# Patient Record
Sex: Male | Born: 1985 | Race: White | Hispanic: No | Marital: Married | Smoking: Former smoker
Health system: Southern US, Community
[De-identification: ages and names within clinical notes are randomized; demographics above are authoritative.]

---

## 2015-04-08 ENCOUNTER — Emergency Department
Admission: EM | Admit: 2015-04-08 | Discharge: 2015-04-08 | Disposition: A | Discharge status: Home / Self Care | Payer: Managed Care, Other (non HMO) | Source: Home / Self Care | Attending: Family Medicine | Admitting: Family Medicine

## 2015-04-08 ENCOUNTER — Encounter: Payer: Self-pay | Admitting: *Deleted

## 2015-04-08 ENCOUNTER — Emergency Department (INDEPENDENT_AMBULATORY_CARE_PROVIDER_SITE_OTHER): Payer: Managed Care, Other (non HMO)

## 2015-04-08 DIAGNOSIS — S8391XA Sprain of unspecified site of right knee, initial encounter: Secondary | ICD-10-CM | POA: Diagnosis not present

## 2015-04-08 DIAGNOSIS — M25561 Pain in right knee: Secondary | ICD-10-CM

## 2015-04-08 NOTE — ED Notes (Signed)
Pt was playing basketball this AM, planted foot then was run into by another player. Taken  IBF, denies pain currently but knee feel "loose". No previous injury. Wearing brace.

## 2015-04-08 NOTE — ED Provider Notes (Signed)
CSN: [id number]     Arrival date & time 04/08/15  4098 History   First MD Initiated Contact with Patient 04/08/15 [phone]     Chief Complaint  Patient presents with  . Knee Injury   (Consider location/radiation/quality/duration/timing/severity/associated sxs/prior Treatment) HPI  The pt is a 30yo male presenting to [facility] with c/o Right lateral knee pain that started suddenly earlier this morning while playing basketball. Pt had his legs planted when another player accidentally ran into the side of his knee, causing it to buckle inward.  Pain is aching and throbbing, sharp at times.  Pain is minimal at this time, his main concern is his knee feels "loose."  He has been wearing a knee sleeve for added support and took  ibuprofen just PTA.  No other injuries. Denies prior Right knee injuries or surgeries.   History reviewed. No pertinent past medical history. History reviewed. No pertinent past surgical history. History reviewed. No pertinent family history. Social History  Substance Use Topics  . Smoking status: Former Games developer  . Smokeless tobacco: Current User  . Alcohol Use: Yes    Review of Systems  Musculoskeletal: Positive for myalgias and arthralgias. Negative for joint swelling.       Right knee  Skin: Negative for color change and wound.  Neurological: Positive for weakness (Right knee). Negative for numbness.    Allergies  Review of patient's allergies indicates no known allergies.  Home Medications   Prior to Admission medications   Not on File   Meds Ordered and Administered this Visit  Medications - No data to display  BP 136/86 mmHg  Pulse 55  Resp 14  Ht  (1.854 m)  Wt 219 lb (99.338 kg)  BMI 28.90 kg/m2  SpO2 100% No data found.   Physical Exam  Constitutional: He is oriented to person, place, and time. He appears well-developed and well-nourished.  HENT:  Head: Normocephalic and atraumatic.  Eyes: EOM are normal.  Neck: Normal range of motion.   Cardiovascular: Normal rate.   Pulmonary/Chest: Effort normal.  Musculoskeletal: Normal range of motion. He exhibits tenderness. He exhibits no edema.  Right knee: no obvious edema or deformity. Slight decreased ROM with knee extension and knee flexion limited to 100*  Calf soft, non-tender. Able to bear weight on Right leg.   Neurological: He is alert and oriented to person, place, and time.  Skin: Skin is warm and dry.  Right knee: skin in tact. No ecchymosis or erythema.   Psychiatric: He has a normal mood and affect. His behavior is normal.  Nursing note and vitals reviewed.   ED Course  Procedures (including critical care time)  Labs Review Labs Reviewed - No data to display  Imaging Review Dg Knee Complete 4 Views Right  04/08/2015  CLINICAL DATA:  Pt c/o lateral right knee pain and tenderness, pt was playing basketball this morning and states he knee bent from lateral to medial and he heard a pop EXAM: RIGHT KNEE - COMPLETE 4+ VIEW COMPARISON:  None. FINDINGS: There is no evidence of fracture, dislocation, or joint effusion. There is no evidence of arthropathy or other focal bone abnormality. Soft tissues are unremarkable. IMPRESSION: Negative. Electronically Signed   By: Corlis Leak M.D.   On: 04/08/2015 10:26      MDM   1. Right knee sprain, initial encounter    Pt c/o Right knee pain and sensation of knee feeling "loose" after injury during basketball just PTA.  Right knee: PMS in  tact with just slight decreased ROM due to pain.  Plain films: negative for acute bony injury, no joint effusion Pain likely due to knee sprain.  Pt has his own knee sleeve he may continue to use.  Encouraged R.I.C.E- Apply a compressive ACE bandage. Rest and elevate the affected painful area.  Apply cold compresses intermittently as needed.  As pain recedes, begin normal activities slowly as tolerated.    Pt is a IT sales professional. Note for light duty for 1 week provided. Encouraged to f/u with  Dr. Denyse Amass, Sports Medicine next week if pain persists. Patient verbalized understanding and agreement with treatment plan. r   Junius Finner, PA-C 04/08/15 1037

## 2016-11-05 IMAGING — CR DG KNEE COMPLETE 4+V*R*
4 series · 4 of 4 positions shown · non-contrast
Comparison: None.

CLINICAL DATA: Pt c/o lateral right knee pain and tenderness, pt
was playing basketball this morning and states he knee bent from
lateral to medial and he heard a pop

EXAM:
RIGHT KNEE - COMPLETE 4+ VIEW

[knee ap]
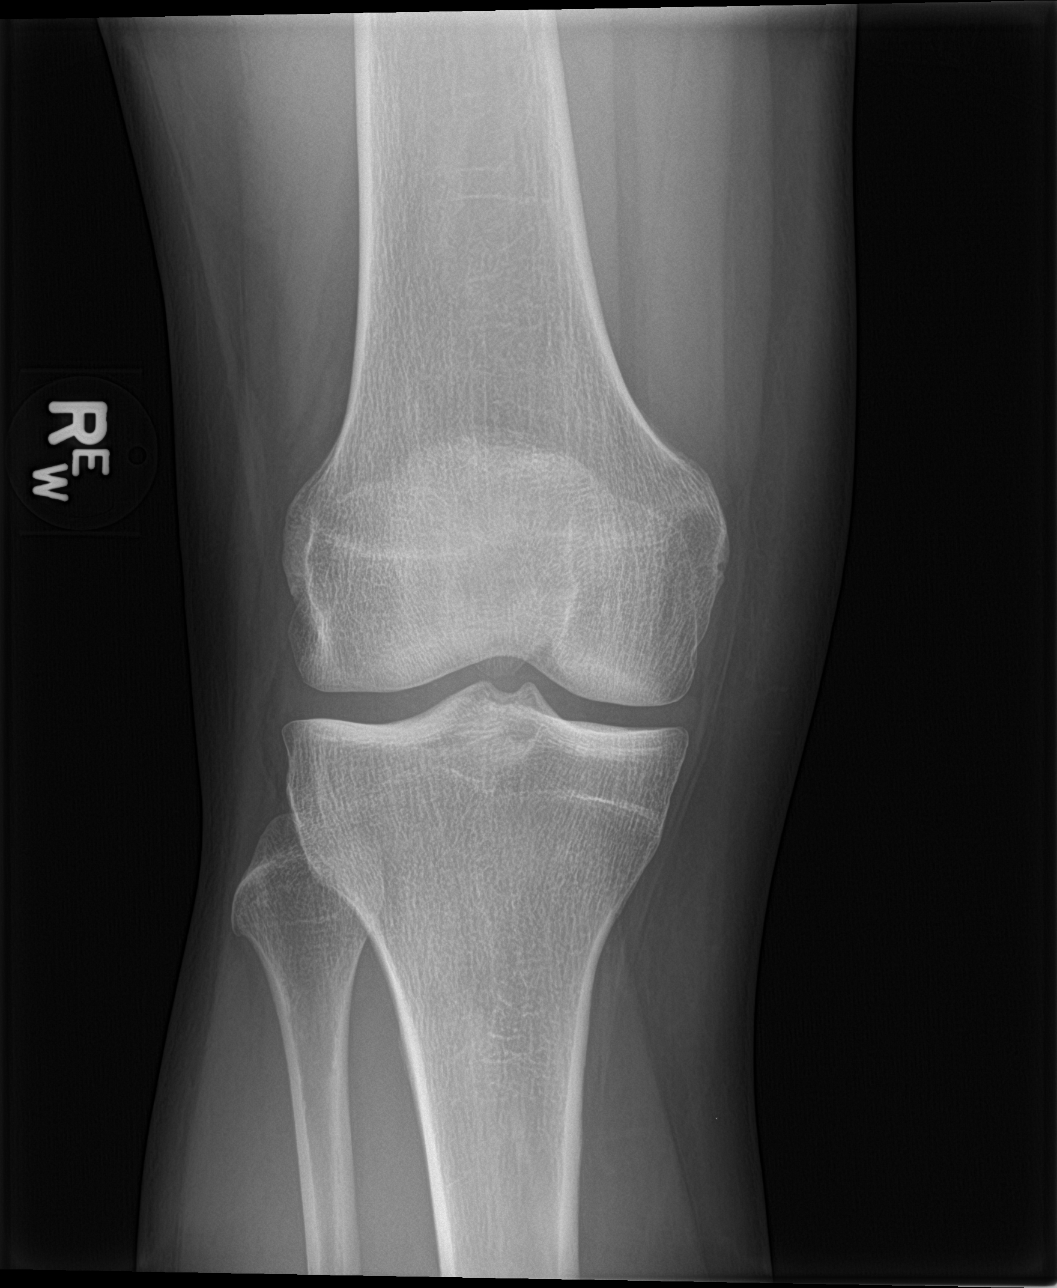

[knee lat]
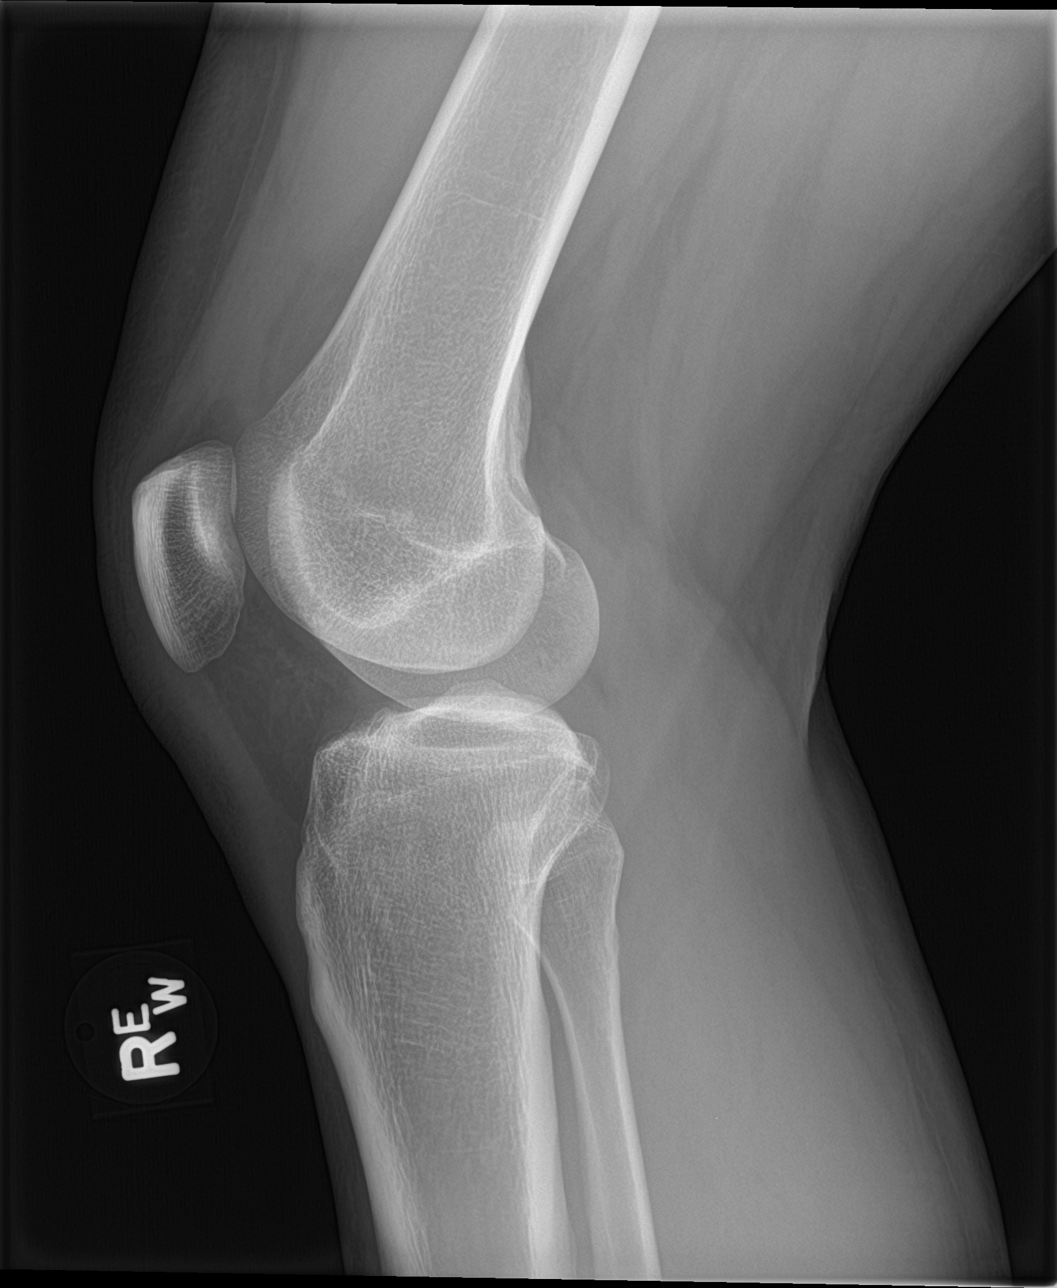

[knee obl (1 of 2)]
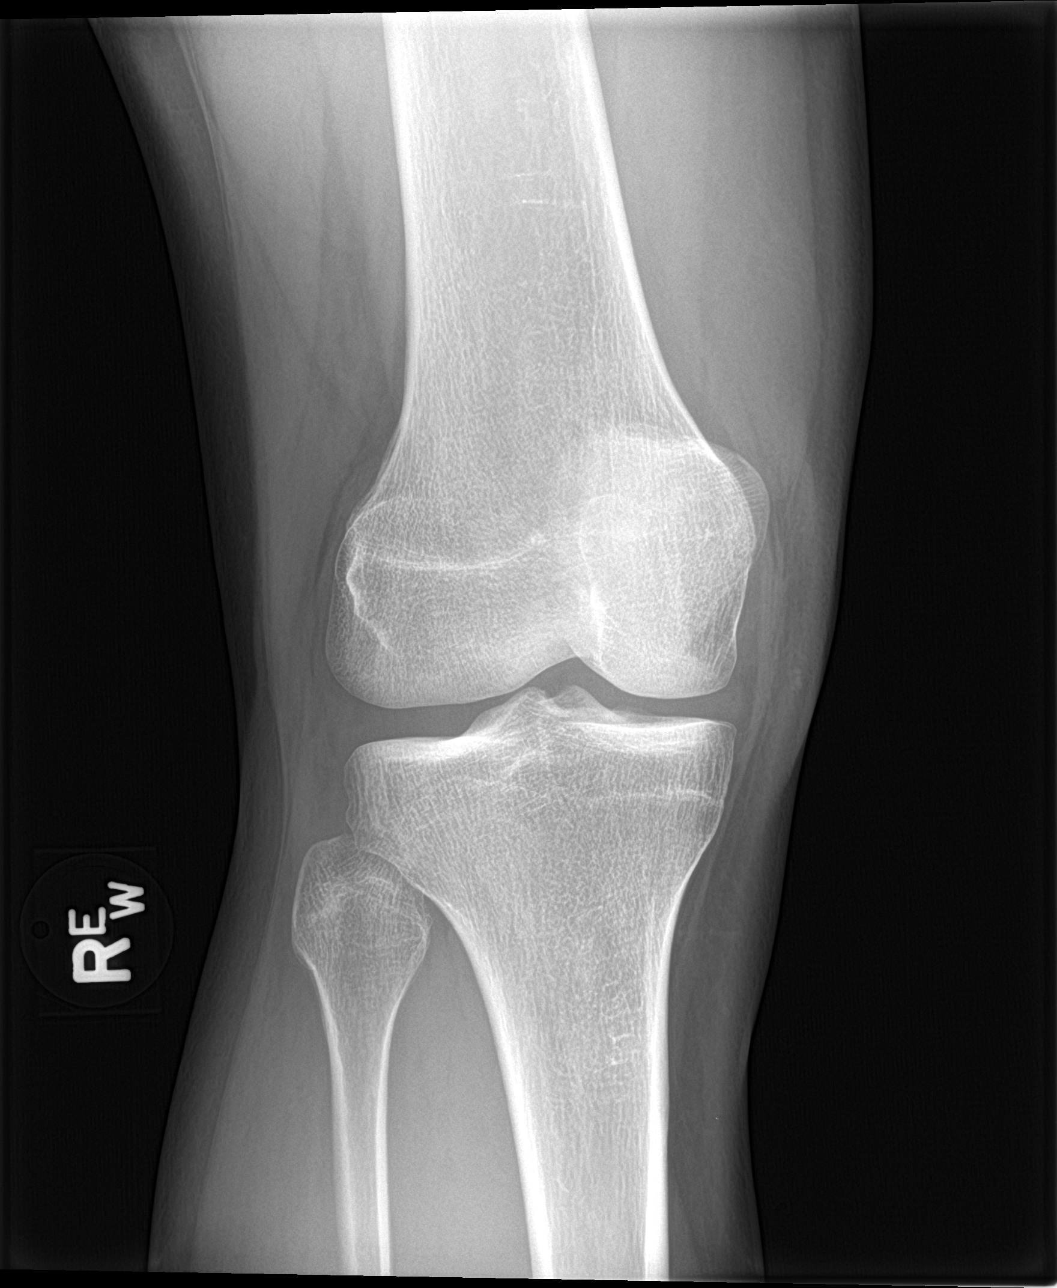

[knee obl (2 of 2)]
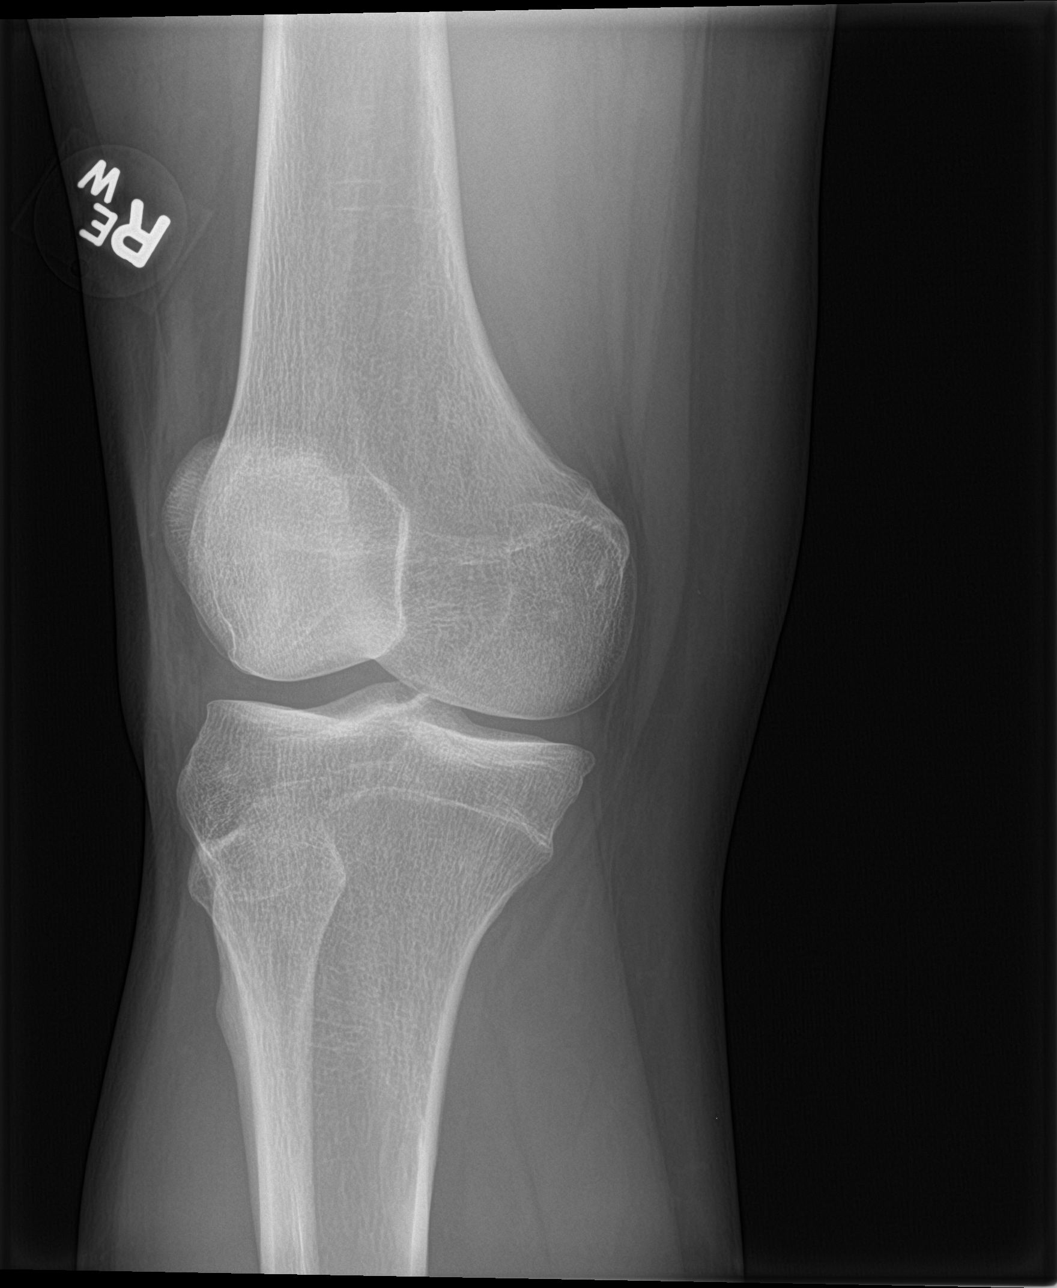

[4 of 4 positions shown; findings below may reference images not displayed]

FINDINGS: There is no evidence of fracture, dislocation, or joint effusion.
There is no evidence of arthropathy or other focal bone abnormality.
Soft tissues are unremarkable.
IMPRESSION: Negative.
